# Patient Record
Sex: Male | Born: 1990 | Race: Black or African American | Hispanic: No | Marital: Single | Smoking: Never smoker
Health system: Southern US, Community
[De-identification: ages and names within clinical notes are randomized; demographics above are authoritative.]

---
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
## 2013-04-13 ENCOUNTER — Encounter: Payer: Self-pay | Admitting: Physician Assistant

## 2013-04-13 ENCOUNTER — Ambulatory Visit (INDEPENDENT_AMBULATORY_CARE_PROVIDER_SITE_OTHER): Payer: 59 | Admitting: Physician Assistant

## 2013-04-13 VITALS — BP 114/74 | HR 60 | Temp 98.3°F | Resp 16 | Ht 70.5 in | Wt 186.0 lb

## 2013-04-13 DIAGNOSIS — L600 Ingrowing nail: Secondary | ICD-10-CM

## 2013-04-13 NOTE — Progress Notes (Signed)
   Patient ID: CHRISTPHOR GROFT MRN: [id number], DOB: 1991-08-08, 22 y.o. Date of Encounter: 04/13/2013, 10:55 AM    Chief Complaint:  Chief Complaint  Patient presents with  . ingrown toenail great toe left foot     HPI: 22 y.o. year old male presents with pain and redness of left first toe. Says it has been bothering him for one week. He has had to have ingrown toenail removed from toe on the rightfoot in the past. He wants to go head and have this one removed.      Home Meds: See attached medication section for any medications that were entered at today's visit. The computer does not put those onto this list.The following list is a list of meds entered prior to today's visit.   Current Outpatient Prescriptions on File Prior to Visit  Medication Sig Dispense Refill  . ibuprofen (ADVIL,MOTRIN) 200 MG tablet Take 200 mg by mouth every 6 (six) hours as needed. For pain       . Pseudoeph-Doxylamine-DM-APAP (NYQUIL) 60-7.03-06-999 MG/30ML LIQD Take 30 mLs by mouth daily as needed. cold        No current facility-administered medications on file prior to visit.    Allergies: No Known Allergies    Review of Systems: See HPI for pertinent ROS. All other ROS negative.    Physical Exam: Blood pressure 114/74, pulse 60, temperature 98.3 F (36.8 C), temperature source Oral, resp. rate 16, height 5' 10.5" (1.791 m), weight 186 lb (84.369 kg)., Body mass index is 26.3 kg/(m^2). General: WNWD male.  Appears in no acute distress. Lungs: Clear bilaterally to auscultation without wheezes, rales, or rhonchi. Breathing is unlabored. Heart: Regular rhythm. No murmurs, rubs, or gallops. Msk:  Strength and tone normal for age. Extremities/Skin: Left First toe: lateral edge of toenail  Has minimal erythema but has swelling with underlying infection.  Neuro: Alert and oriented X 3. Moves all extremities spontaneously. Gait is normal. CNII-XII grossly in tact. Psych:  Responds to questions appropriately  with a normal affect.     ASSESSMENT AND PLAN:  22 y.o. year old male with  1. Ingrown left greater toenail I explained that I donot remove ingrown toenails but Dr. Tanya Nones does. Dr. Tanya Nones was available to do the procedure. Pt was agreeable for him to do the procedure.  Digital block was performed using lidocaine injection on each side of the toe. Torniquette was applied. Elevator was used to lift the lateral edge of the nail. The nail was then cut and pulled out. Lateral edge of nail was removed. Toe was bandaged and wrapped with coban. He is to keep this on for 24 hours then can apply neosporin, bandaid, and overlying bandage.  F/U if any concerns.    Signed, [address] [place], [place], [facility] 04/13/2013 10:55 AM

## 2013-12-04 ENCOUNTER — Encounter: Payer: Self-pay | Admitting: Family Medicine

## 2013-12-04 ENCOUNTER — Ambulatory Visit (INDEPENDENT_AMBULATORY_CARE_PROVIDER_SITE_OTHER): Payer: 59 | Admitting: Family Medicine

## 2013-12-04 VITALS — BP 120/60 | HR 72 | Temp 98.2°F | Resp 12 | Ht 71.0 in | Wt 216.0 lb

## 2013-12-04 DIAGNOSIS — L600 Ingrowing nail: Secondary | ICD-10-CM

## 2013-12-04 NOTE — Progress Notes (Signed)
   Subjective:    Patient ID: Brian Joyce, male    DOB: July 10, 1991, 23 y.o.   MRN: [id number]  HPI The patient presents with an ingrown left great toenail. It is the medial portion.  His been ingrown in the last few weeks. We previously had to remove the medial portion, but it has regrown.  He is requesting removal. No past medical history on file. No current outpatient prescriptions on file prior to visit.   No current facility-administered medications on file prior to visit.   No Known Allergies History   Social History  . Marital Status: Single    Spouse Name: N/A    Number of Children: N/A  . Years of Education: N/A   Occupational History  . Not on file.   Social History Main Topics  . Smoking status: Never Smoker   . Smokeless tobacco: Not on file  . Alcohol Use: No  . Drug Use: No  . Sexual Activity:    Other Topics Concern  . Not on file   Social History Narrative  . No narrative on file      Review of Systems  All other systems reviewed and are negative.       Objective:   Physical Exam  Vitals reviewed. Cardiovascular: Normal rate and regular rhythm.   Pulmonary/Chest: Effort normal and breath sounds normal.   left great toenail is ingrown along the medial margin.        Assessment & Plan:  1. Ingrown left big toenail I anesthetized the left great toe using a digital block with 0.1% lidocaine without epinephrine. I applied a tourniquet to the base of the toenail. I then separated the toenail using an elevator along the medial ingrown portion. I then cut longitudinally along the nail to the base of the nail and removed the ingrown portion only using traction.  I covered the wound with petroleum gauze and wrapped it in a band. Blood loss was minimal. The tourniquet was removed. Total tourniquet time was less than 1 minute. The patient tolerated the procedure well with no complications. He is requesting a podiatry consult for destruction of the  underlying nail matrix so that it does not grow back - Ambulatory referral to Podiatry

## 2013-12-18 ENCOUNTER — Ambulatory Visit (INDEPENDENT_AMBULATORY_CARE_PROVIDER_SITE_OTHER): Payer: 59 | Admitting: Podiatry

## 2013-12-18 ENCOUNTER — Encounter: Payer: Self-pay | Admitting: Podiatry

## 2013-12-18 VITALS — BP 137/75 | HR 56 | Ht 71.0 in | Wt 205.0 lb

## 2013-12-18 DIAGNOSIS — M79609 Pain in unspecified limb: Secondary | ICD-10-CM

## 2013-12-18 DIAGNOSIS — M79676 Pain in unspecified toe(s): Secondary | ICD-10-CM

## 2013-12-18 DIAGNOSIS — L600 Ingrowing nail: Secondary | ICD-10-CM

## 2013-12-18 MED ORDER — HYDROCODONE-IBUPROFEN 7.5-200 MG PO TABS: 1.0000 | ORAL_TABLET | Freq: Three times a day (TID) | ORAL | Status: DC | PRN

## 2013-12-18 MED ORDER — CEPHALEXIN 500 MG PO CAPS: 500.0000 mg | ORAL_CAPSULE | Freq: Four times a day (QID) | ORAL | Status: DC

## 2013-12-18 NOTE — Progress Notes (Signed)
Subjective: 23 year old male presents requesting a permanent procedure for chronic ingrown nail on left great toe.  Stated that he has done a temporary procedure at his PCP office. He was recommended to have a permanent procedure done for this toe nail and he is ready to have that procedure. He has had done permanent procedure done on right a few years back and has no more problem.  Objective: Status post ingrown nail with temporary procedure done on left great toe medial border.  Neurovascular status are within normal.  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   Assessment: Chronic painful ingrown nail left hallux.   Treatment: Excision nail and matrix medial border left great toe done. Affected toe was anesthetized with total 5ml mixture of 50/50 0.5% Marcaine plain and 1% Xylocaine plain. Affected nail border was reflected with a nail elevator and excised with nail nipper. Proximal nail matrix tissue was excised with #15 blade. The wound was dressed with Amerigel ointment dressing. Home care instructions dispensed.  Keflex and Vicodin 7.5 mg prescribed.  Return in 1 week for follow up.

## 2013-12-18 NOTE — Patient Instructions (Signed)
Permanent nail procedure done on left great toe. Keep the bandage dry and intact till next appointment.

## 2013-12-23 ENCOUNTER — Encounter: Payer: 59 | Admitting: Podiatry

## 2014-08-13 ENCOUNTER — Ambulatory Visit: Payer: 59 | Admitting: Family Medicine

## 2014-08-13 ENCOUNTER — Ambulatory Visit: Payer: Self-pay | Admitting: Family Medicine

## 2014-08-13 ENCOUNTER — Ambulatory Visit (INDEPENDENT_AMBULATORY_CARE_PROVIDER_SITE_OTHER): Payer: 59 | Admitting: Family Medicine

## 2014-08-13 DIAGNOSIS — Z23 Encounter for immunization: Secondary | ICD-10-CM

## 2014-10-14 ENCOUNTER — Ambulatory Visit (INDEPENDENT_AMBULATORY_CARE_PROVIDER_SITE_OTHER): Payer: 59 | Admitting: *Deleted

## 2014-10-14 DIAGNOSIS — Z23 Encounter for immunization: Secondary | ICD-10-CM | POA: Insufficient documentation

## 2014-10-14 NOTE — Progress Notes (Signed)
Patient ID: Brian Joyce, male   DOB: 1990/11/06, 24 y.o.   MRN: [id number] Patient seen in office for Influenza vaccination and Hepatitis A/B immunization.   Tolerated IM administration well.   Immunization history updated.

## 2014-11-01 ENCOUNTER — Telehealth: Payer: Self-pay | Admitting: Physician Assistant

## 2014-11-01 NOTE — Telephone Encounter (Signed)
[phone] PT is in [place] and would like to have a referral down there to have his ingrown toe nail. He has an apt on Thursday here but would like to have one down there. Contact the mother sandra at the number given if we can do a referral

## 2014-11-04 ENCOUNTER — Encounter: Payer: Self-pay | Admitting: Family Medicine

## 2014-11-04 ENCOUNTER — Ambulatory Visit (INDEPENDENT_AMBULATORY_CARE_PROVIDER_SITE_OTHER): Payer: 59 | Admitting: Family Medicine

## 2014-11-04 VITALS — BP 118/60 | HR 76 | Temp 98.1°F | Resp 14 | Ht 71.0 in | Wt 213.0 lb

## 2014-11-04 DIAGNOSIS — L600 Ingrowing nail: Secondary | ICD-10-CM

## 2014-11-04 MED ORDER — SULFAMETHOXAZOLE-TRIMETHOPRIM 400-80 MG PO TABS: 1.0000 | ORAL_TABLET | Freq: Two times a day (BID) | ORAL | Status: DC

## 2014-11-04 NOTE — Progress Notes (Signed)
   Subjective:    Patient ID: Brian Joyce, male    DOB: November 15, 1990, 24 y.o.   MRN: [id number]  HPI Patient is here today with an ingrown toenail on his left great toe. The lateral nail margin is ingrown. He also has a paronychia in that area. Patient requests removal of ingrown toenail and destruction of the nail matrix to prevent recurrences No past medical history on file. No past surgical history on file. No current outpatient prescriptions on file prior to visit.   No current facility-administered medications on file prior to visit.   No Known Allergies History   Social History  . Marital Status: Single    Spouse Name: N/A    Number of Children: N/A  . Years of Education: N/A   Occupational History  . Not on file.   Social History Main Topics  . Smoking status: Never Smoker   . Smokeless tobacco: Never Used  . Alcohol Use: No  . Drug Use: No  . Sexual Activity: Not on file   Other Topics Concern  . Not on file   Social History Narrative      Review of Systems  All other systems reviewed and are negative.      Objective:   Physical Exam  Cardiovascular: Normal rate and regular rhythm.   Pulmonary/Chest: Effort normal and breath sounds normal.  Vitals reviewed. Left lateral greater toenail margin is ingrown there is also a paronychia.        Assessment & Plan:  Ingrown left greater toenail  Left great toe was anesthetized with a digital block using 0.1% lidocaine without epinephrine. A tourniquet was applied. The lateral nail fold was raised using an elevator. A longitudinal incision was made along the ingrown portion of the toenail and the ingrown portion of the nail was removed with gentle traction. Phenol was then applied to the nail matrix using a Q-tip. Care was taken to avoid contact of the phenol surrounding skin. The phenol was then quenched with copious irrigation with isopropyl alcohol. The wound bed was then covered with Polysporin, petroleum  gauze, and Coban band. The patient was started on Bactrim 1 tablet by mouth twice a day for 7 days. Tourniquet was removed. Total tourniquet time was less than 5 minutes..Marland Kitchen

## 2014-11-12 ENCOUNTER — Ambulatory Visit: Payer: 59 | Admitting: Family Medicine

## 2015-10-13 ENCOUNTER — Ambulatory Visit (INDEPENDENT_AMBULATORY_CARE_PROVIDER_SITE_OTHER): Payer: Commercial Managed Care - HMO | Admitting: *Deleted

## 2015-10-13 DIAGNOSIS — Z23 Encounter for immunization: Secondary | ICD-10-CM | POA: Diagnosis not present

## 2015-10-13 NOTE — Progress Notes (Signed)
Patient ID: Brian Joyce, male   DOB: [birth year], 25 y.o.   MRN: [id number]  Patient seen in office for Influenza Vaccination.   Tolerated IM administration well.   Immunization history updated.
